# Patient Record
Sex: Female | Born: 1990 | Race: Black or African American | Hispanic: No | Marital: Single | Smoking: Former smoker
Health system: Southern US, Community
[De-identification: ages and names within clinical notes are randomized; demographics above are authoritative.]

## PROBLEM LIST (undated history)

## (undated) DIAGNOSIS — F419 Anxiety disorder, unspecified: Secondary | ICD-10-CM

## (undated) DIAGNOSIS — F329 Major depressive disorder, single episode, unspecified: Secondary | ICD-10-CM

## (undated) DIAGNOSIS — F32A Depression, unspecified: Secondary | ICD-10-CM

---
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
## 2019-08-16 ENCOUNTER — Emergency Department (HOSPITAL_COMMUNITY)
Admission: EM | Admit: 2019-08-16 | Discharge: 2019-08-16 | Disposition: A | Discharge status: Home / Self Care | Payer: Medicaid Other | Attending: Emergency Medicine | Admitting: Emergency Medicine

## 2019-08-16 ENCOUNTER — Emergency Department (HOSPITAL_COMMUNITY): Payer: Medicaid Other

## 2019-08-16 ENCOUNTER — Other Ambulatory Visit: Payer: Self-pay

## 2019-08-16 ENCOUNTER — Encounter (HOSPITAL_COMMUNITY): Payer: Self-pay | Admitting: Emergency Medicine

## 2019-08-16 DIAGNOSIS — Z87891 Personal history of nicotine dependence: Secondary | ICD-10-CM | POA: Insufficient documentation

## 2019-08-16 DIAGNOSIS — R0602 Shortness of breath: Secondary | ICD-10-CM | POA: Insufficient documentation

## 2019-08-16 DIAGNOSIS — Z20822 Contact with and (suspected) exposure to covid-19: Secondary | ICD-10-CM | POA: Insufficient documentation

## 2019-08-16 DIAGNOSIS — F419 Anxiety disorder, unspecified: Secondary | ICD-10-CM

## 2019-08-16 DIAGNOSIS — R0789 Other chest pain: Secondary | ICD-10-CM | POA: Insufficient documentation

## 2019-08-16 HISTORY — DX: Depression, unspecified: F32.A

## 2019-08-16 HISTORY — DX: Anxiety disorder, unspecified: F41.9

## 2019-08-16 LAB — URINALYSIS, ROUTINE W REFLEX MICROSCOPIC
Bilirubin Urine: NEGATIVE
Glucose, UA: NEGATIVE mg/dL
Hgb urine dipstick: NEGATIVE
Ketones, ur: 5 mg/dL — AB
Leukocytes,Ua: NEGATIVE
Nitrite: NEGATIVE
Protein, ur: NEGATIVE mg/dL
Specific Gravity, Urine: 1.002 — ABNORMAL LOW (ref 1.005–1.030)
pH: 6 (ref 5.0–8.0)

## 2019-08-16 LAB — BASIC METABOLIC PANEL
Anion gap: 10 (ref 5–15)
BUN: 6 mg/dL (ref 6–20)
CO2: 23 mmol/L (ref 22–32)
Calcium: 9.2 mg/dL (ref 8.9–10.3)
Chloride: 105 mmol/L (ref 98–111)
Creatinine, Ser: 0.66 mg/dL (ref 0.44–1.00)
GFR calc Af Amer: >60 mL/min (ref >60)
GFR calc non Af Amer: >60 mL/min (ref >60)
Glucose, Bld: 118 mg/dL — ABNORMAL HIGH (ref 70–99)
Potassium: 3.4 mmol/L — ABNORMAL LOW (ref 3.5–5.1)
Sodium: 138 mmol/L (ref 135–145)

## 2019-08-16 LAB — CBC WITH DIFFERENTIAL/PLATELET
Abs Immature Granulocytes: 0.03 10*3/uL (ref 0.00–0.07)
Basophils Absolute: 0 10*3/uL (ref 0.0–0.1)
Basophils Relative: 0 %
Eosinophils Absolute: 0.1 10*3/uL (ref 0.0–0.5)
Eosinophils Relative: 1 %
HCT: 38.2 % (ref 36.0–46.0)
Hemoglobin: 12.1 g/dL (ref 12.0–15.0)
Immature Granulocytes: 0 %
Lymphocytes Relative: 14 %
Lymphs Abs: 1.3 10*3/uL (ref 0.7–4.0)
MCH: 28.1 pg (ref 26.0–34.0)
MCHC: 31.7 g/dL (ref 30.0–36.0)
MCV: 88.6 fL (ref 80.0–100.0)
Monocytes Absolute: 0.5 10*3/uL (ref 0.1–1.0)
Monocytes Relative: 5 %
Neutro Abs: 7.3 10*3/uL (ref 1.7–7.7)
Neutrophils Relative %: 80 %
Platelets: 363 10*3/uL (ref 150–400)
RBC: 4.31 MIL/uL (ref 3.87–5.11)
RDW: 12.9 % (ref 11.5–15.5)
WBC: 9.3 10*3/uL (ref 4.0–10.5)
nRBC: 0 % (ref 0.0–0.2)

## 2019-08-16 LAB — D-DIMER, QUANTITATIVE: D-Dimer, Quant: 0.43 ug/mL-FEU (ref 0.00–0.50)

## 2019-08-16 LAB — POC SARS CORONAVIRUS 2 AG -  ED: SARS Coronavirus 2 Ag: NEGATIVE

## 2019-08-16 NOTE — ED Triage Notes (Signed)
Patient c/o sudden shortness of breath that started approx 20 minutes ago. Denies any chest pain or hx of asthma/COPD. Patient states she did get a headache before the shortness of breath and that her hands are tingling bilaterally. Patient does have hx of anxiety. Per patient also has had some nausea, vomiting, and heart burn related to Cipro that she was placed on 5 days ago for UTI.

## 2019-08-16 NOTE — Discharge Instructions (Signed)
Follow up with your Physician for recheck.  Return if any problems.  

## 2019-08-16 NOTE — ED Provider Notes (Signed)
[facility] EMERGENCY DEPARTMENT Provider Note   CSN: [id number] Arrival date & time: 08/16/19  0849     History Chief Complaint  Patient presents with  . Shortness of Breath    Mercedes Perry is a 29 y.o. female.  The history is provided by the patient. No language interpreter was used.  Shortness of Breath Severity:  Moderate Onset quality:  Gradual Duration:  20 minutes Timing:  Intermittent Progression:  Improving Chronicity:  New Relieved by:  Nothing Worsened by:  Nothing Ineffective treatments:  None tried Risk factors: no tobacco use    Pt reports she began feeling short of breath.  Pt states her fingers and toes were tingling.  Pt had chest tightness.  Pt has had a history of anxiety     Past Medical History:  Diagnosis Date  . Anxiety   . Depression     There are no problems to display for this patient.   History reviewed. No pertinent surgical history.   OB History    Gravida  0   Para  0   Term  0   Preterm  0   AB  0   Living  0     SAB  0   TAB  0   Ectopic  0   Multiple  0   Live Births  0           History reviewed. No pertinent family history.  Social History   Tobacco Use  . Smoking status: Former Smoker    Packs/day: 0.02    Years: 5.00    Pack years: 0.10    Types: Cigarettes    Quit date: 03/31/2019    Years since quitting: 0.3  . Smokeless tobacco: Never Used  Substance Use Topics  . Alcohol use: Never  . Drug use: Never    Home Medications Prior to Admission medications   Not on File    Allergies    Augmentin [amoxicillin-pot clavulanate]  Review of Systems   Review of Systems  Respiratory: Positive for shortness of breath.   All other systems reviewed and are negative.   Physical Exam Updated Vital Signs BP 116/71   Pulse 80   Temp 99.1 F (37.3 C) (Oral)   Resp (!) 22   Ht 5\' 4"  (1.626 m)   Wt 97.5 kg   LMP 07/25/2019   SpO2 97%   BMI 36.90 kg/m   Physical Exam Vitals and  nursing note reviewed.  Constitutional:      Appearance: She is well-developed.  HENT:     Head: Normocephalic.  Cardiovascular:     Rate and Rhythm: Tachycardia present.  Pulmonary:     Effort: Pulmonary effort is normal.     Breath sounds: Normal breath sounds.  Chest:     Chest wall: No tenderness.  Abdominal:     General: There is no distension.  Musculoskeletal:        General: Normal range of motion.     Cervical back: Normal range of motion.  Neurological:     General: No focal deficit present.     Mental Status: She is alert and oriented to person, place, and time.     ED Results / Procedures / Treatments   Labs (all labs ordered are listed, but only abnormal results are displayed) Labs Reviewed  URINALYSIS, ROUTINE W REFLEX MICROSCOPIC - Abnormal; Notable for the following components:      Result Value   Color, Urine COLORLESS (*)  Specific Gravity, Urine 1.002 (*)    Ketones, ur 5 (*)    All other components within normal limits  BASIC METABOLIC PANEL - Abnormal; Notable for the following components:   Potassium 3.4 (*)    Glucose, Bld 118 (*)    All other components within normal limits  CBC WITH DIFFERENTIAL/PLATELET  D-DIMER, QUANTITATIVE (NOT AT [facility])  POC SARS CORONAVIRUS 2 AG -  ED    EKG EKG Interpretation  Date/Time:  Sunday August 16 2019 09:05:02 EDT Ventricular Rate:  118 PR Interval:    QRS Duration: 83 QT Interval:  307 QTC Calculation: 431 R Axis:   71 Text Interpretation: Sinus tachycardia Probable left atrial enlargement Borderline T abnormalities, inferior leads Confirmed by Geoffery Lyons ([id number]) on 08/16/2019 9:17:57 AM   Radiology DG Chest Port 1 View  Result Date: 08/16/2019 CLINICAL DATA:  Cough and shortness of breath. EXAM: PORTABLE CHEST 1 VIEW COMPARISON:  None. FINDINGS: Midline trachea. Normal heart size and mediastinal contours. Numerous leads and wires project over the chest. No pleural effusion or pneumothorax. Clear  lungs. IMPRESSION: Normal chest. Electronically Signed   By: Jeronimo Greaves M.D.   On: 08/16/2019 10:05    Procedures Procedures (including critical care time)  Medications Ordered in ED Medications - No data to display  ED Course  I have reviewed the triage vital signs and the nursing notes.  Pertinent labs & imaging results that were available during my care of the patient were reviewed by me and considered in my medical decision making (see chart for details).    MDM Rules/Calculators/A&P                      MDM:  Pt tachycardiac on arrival.  Pt seems anxious,  Pt's heart rate normlized to 70's with rest,  Ddimer is normal  EKG is normal, chest xray is normal.  Pt is pain free.  I suspect anxiety.  Pt counseled on symptoms arn advised to follow up with primary MD for recheck  Final Clinical Impression(s) / ED Diagnoses Final diagnoses:  SOB (shortness of breath)  Anxiety    Rx / DC Orders ED Discharge Orders    None    An After Visit Summary was printed and given to the patient.    Elson Areas, [place] 08/16/19 1246    Geoffery Lyons, MD 08/16/19 1453

## 2020-09-15 IMAGING — DX DG CHEST 1V PORT
1 series · 1 of 1 positions shown · non-contrast
Comparison: None.

CLINICAL DATA: Cough and shortness of breath.

EXAM:
PORTABLE CHEST 1 VIEW

[chest ap]
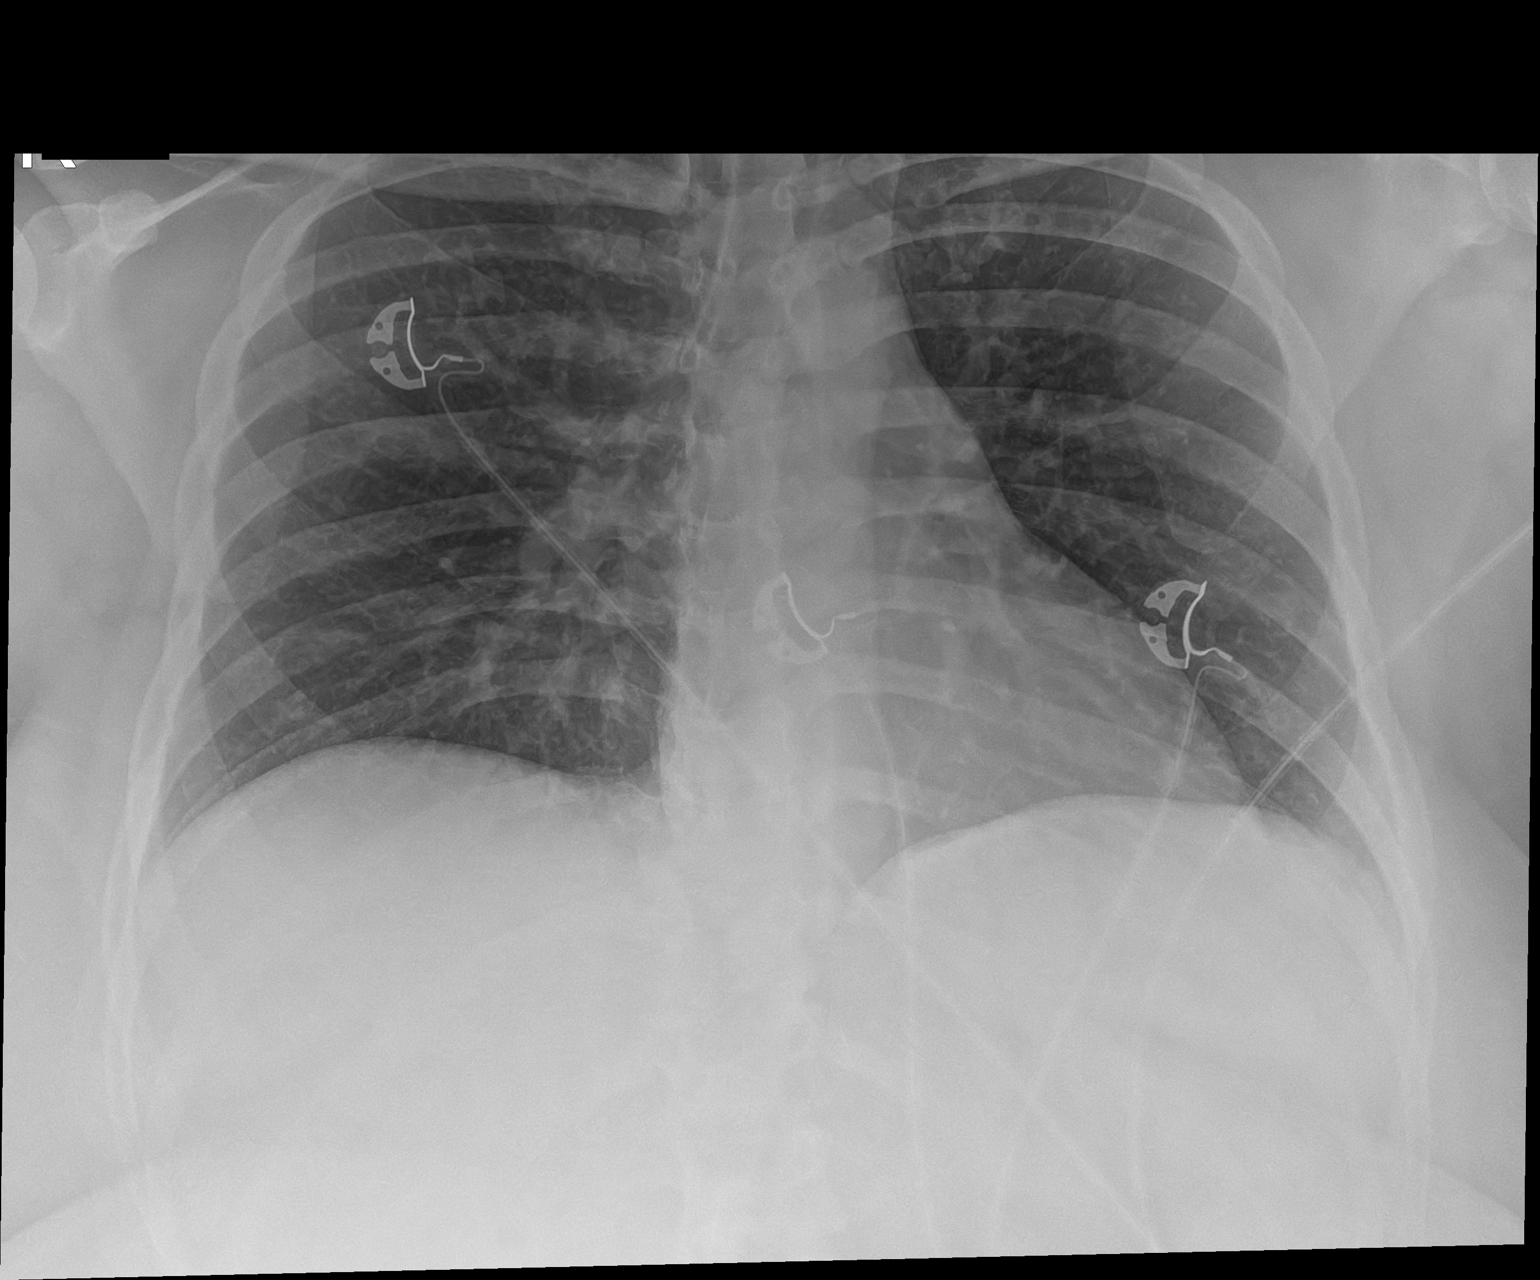

[1 of 1 positions shown; findings below may reference images not displayed]

FINDINGS: Midline trachea. Normal heart size and mediastinal contours.
Numerous leads and wires project over the chest. No pleural effusion
or pneumothorax. Clear lungs.
IMPRESSION: Normal chest.
